# Patient Record
Sex: Male | Born: 1975 | Race: Black or African American | Hispanic: No | Marital: Married | Smoking: Never smoker
Health system: Southern US, Community
[De-identification: ages and names within clinical notes are randomized; demographics above are authoritative.]

## PROBLEM LIST (undated history)

## (undated) HISTORY — PX: CHOLECYSTECTOMY: SHX55

---

## 2015-03-08 ENCOUNTER — Emergency Department (HOSPITAL_COMMUNITY): Payer: 59

## 2015-03-08 ENCOUNTER — Encounter (HOSPITAL_COMMUNITY): Payer: Self-pay | Admitting: Family Medicine

## 2015-03-08 ENCOUNTER — Emergency Department (HOSPITAL_COMMUNITY)
Admission: EM | Admit: 2015-03-08 | Discharge: 2015-03-08 | Disposition: A | Discharge status: Home / Self Care | Payer: 59 | Attending: Emergency Medicine | Admitting: Emergency Medicine

## 2015-03-08 DIAGNOSIS — R0602 Shortness of breath: Secondary | ICD-10-CM | POA: Diagnosis not present

## 2015-03-08 DIAGNOSIS — R079 Chest pain, unspecified: Secondary | ICD-10-CM

## 2015-03-08 DIAGNOSIS — R0789 Other chest pain: Secondary | ICD-10-CM | POA: Diagnosis not present

## 2015-03-08 LAB — CBC
HCT: 43.7 % (ref 39.0–52.0)
Hemoglobin: 15.5 g/dL (ref 13.0–17.0)
MCH: 31.6 pg (ref 26.0–34.0)
MCHC: 35.5 g/dL (ref 30.0–36.0)
MCV: 89 fL (ref 78.0–100.0)
PLATELETS: 149 10*3/uL — AB (ref 150–400)
RBC: 4.91 MIL/uL (ref 4.22–5.81)
RDW: 13.6 % (ref 11.5–15.5)
WBC: 7.2 10*3/uL (ref 4.0–10.5)

## 2015-03-08 LAB — BASIC METABOLIC PANEL
Anion gap: 10 (ref 5–15)
BUN: 11 mg/dL (ref 6–20)
CALCIUM: 9 mg/dL (ref 8.9–10.3)
CO2: 26 mmol/L (ref 22–32)
CREATININE: 1.03 mg/dL (ref 0.61–1.24)
Chloride: 103 mmol/L (ref 101–111)
GFR calc non Af Amer: >60 mL/min (ref >60)
Glucose, Bld: 90 mg/dL (ref 65–99)
Potassium: 3.6 mmol/L (ref 3.5–5.1)
SODIUM: 139 mmol/L (ref 135–145)

## 2015-03-08 LAB — I-STAT TROPONIN, ED
TROPONIN I, POC: 0.01 ng/mL (ref 0.00–0.08)
TROPONIN I, POC: 0.01 ng/mL (ref 0.00–0.08)

## 2015-03-08 NOTE — ED Notes (Signed)
Pt reports he is experiencing right and left sided chest pain that started about 3 weeks ago but got severely worse today. Pt states he was riding from church when the pain got worse today.

## 2015-03-08 NOTE — ED Provider Notes (Signed)
CSN: [id number]     Arrival date & time 03/08/15  1705 History   First MD Initiated Contact with Patient 03/08/15 1725     Chief Complaint  Patient presents with  . Chest Pain     (Consider location/radiation/quality/duration/timing/severity/associated sxs/prior Treatment) HPI  40 year old male presents with chest pain. Has been having chest pain about every day for the past 3 weeks. Today around 1 PM when he was in the grocery store and then it was worse than typical. Patient states the pain is under his left breast and then moves to under his right breast. Currently is under his left. Pain seems to come and go randomly each day, since 1 pm has been constant. Feels like a heaviness. Some dyspnea as well. No vomiting, nausea or diaphoresis. Not worse or caused by exertion. Not pleuritic. No back pain/abdominal pain. No leg swelling/pain.  History reviewed. No pertinent past medical history. Past Surgical History  Procedure Laterality Date  . Cholecystectomy     History reviewed. No pertinent family history. Social History  Substance Use Topics  . Smoking status: Never Smoker   . Smokeless tobacco: None  . Alcohol Use: No    Review of Systems  Constitutional: Negative for fever.  Respiratory: Positive for shortness of breath.   Cardiovascular: Positive for chest pain. Negative for leg swelling.  Gastrointestinal: Negative for nausea and vomiting.  All other systems reviewed and are negative.     Allergies  Review of patient's allergies indicates not on file.  Home Medications   Prior to Admission medications   Not on File   BP 137/81 mmHg  Pulse 73  Temp(Src) 98.1 F (36.7 C) (Oral)  Resp 16  Ht 5\' 9"  (1.753 m)  Wt 258 lb (117.028 kg)  BMI 38.08 kg/m2  SpO2 100% Physical Exam  Constitutional: He is oriented to person, place, and time. He appears well-developed and well-nourished.  HENT:  Head: Normocephalic and atraumatic.  Right Ear: External ear normal.   Left Ear: External ear normal.  Nose: Nose normal.  Eyes: Right eye exhibits no discharge. Left eye exhibits no discharge.  Neck: Neck supple.  Cardiovascular: Normal rate, regular rhythm, normal heart sounds and intact distal pulses.   No murmur heard. Pulses:      Radial pulses are 2+ on the right side, and 2+ on the left side.  Pulmonary/Chest: Effort normal and breath sounds normal. He exhibits no tenderness.  Abdominal: Soft. There is no tenderness.  Musculoskeletal: He exhibits no edema.  Neurological: He is alert and oriented to person, place, and time.  Skin: Skin is warm and dry.  Nursing note and vitals reviewed.   ED Course  Procedures (including critical care time) Labs Review Labs Reviewed  CBC - Abnormal; Notable for the following:    Platelets 149 (*)    All other components within normal limits  BASIC METABOLIC PANEL  I-STAT TROPOININ, ED  Randolm Idol, ED    Imaging Review Dg Chest 2 View  03/08/2015  CLINICAL DATA:  Chest pain for 3 days EXAM: CHEST  2 VIEW COMPARISON:  None. FINDINGS: The heart size and mediastinal contours are within normal limits. Both lungs are clear. The visualized skeletal structures are unremarkable. IMPRESSION: No active cardiopulmonary disease. Electronically Signed   By: Inez Catalina M.D.   On: 03/08/2015 17:44   I have personally reviewed and evaluated these images and lab results as part of my medical decision-making.   EKG Interpretation   Date/Time:  Sunday  March 08 2015 17:12:28 EST Ventricular Rate:  72 PR Interval:  175 QRS Duration: 95 QT Interval:  391 QTC Calculation: 428 R Axis:   31 Text Interpretation:  Sinus rhythm Abnormal R-wave progression, early  transition no acute ST/T changes Baseline wander in lead(s) V1 V3 V5 No  old tracing to compare Confirmed by Juda ([id number]) on  03/08/2015 5:26:28 PM      MDM   Final diagnoses:  Chest pain, unspecified chest pain type    Patient has  had intermittent atypical chest pain for about 3 weeks. Was pain is worse today, I highly doubt ACS with negative ECG and 2 negative troponins. Patient's pain is not consistent with pulmonary embolism and he is low risk with a negative PERC score. Highly doubt dissection. At this point, plan to discharge home given he has good outpatient follow-up and discuss the importance of good follow-up. Discussed strict return precautions. Likely this is musculoskeletal.    Sherwood Gambler, MD 03/09/15 [id number]

## 2015-03-08 NOTE — ED Notes (Signed)
Patient transported to X-ray 

## 2015-08-25 ENCOUNTER — Other Ambulatory Visit: Payer: Self-pay | Admitting: Occupational Medicine

## 2015-08-25 ENCOUNTER — Ambulatory Visit: Payer: Self-pay

## 2015-08-25 DIAGNOSIS — Z0000 Encounter for general adult medical examination without abnormal findings: Secondary | ICD-10-CM

## 2016-04-21 DIAGNOSIS — Z0000 Encounter for general adult medical examination without abnormal findings: Secondary | ICD-10-CM | POA: Diagnosis not present

## 2016-04-21 DIAGNOSIS — M25562 Pain in left knee: Secondary | ICD-10-CM | POA: Diagnosis not present

## 2016-04-21 DIAGNOSIS — M25511 Pain in right shoulder: Secondary | ICD-10-CM | POA: Diagnosis not present

## 2016-05-16 DIAGNOSIS — M25511 Pain in right shoulder: Secondary | ICD-10-CM | POA: Diagnosis not present

## 2016-05-30 DIAGNOSIS — M542 Cervicalgia: Secondary | ICD-10-CM | POA: Diagnosis not present

## 2016-05-30 DIAGNOSIS — M25512 Pain in left shoulder: Secondary | ICD-10-CM | POA: Diagnosis not present

## 2016-05-30 DIAGNOSIS — M25511 Pain in right shoulder: Secondary | ICD-10-CM | POA: Diagnosis not present

## 2017-03-25 IMAGING — CR DG CHEST 2V
2 series · 2 of 2 positions shown · non-contrast
Comparison: None.

CLINICAL DATA: Chest pain for 3 days

EXAM:
CHEST  2 VIEW

[w chest pa]
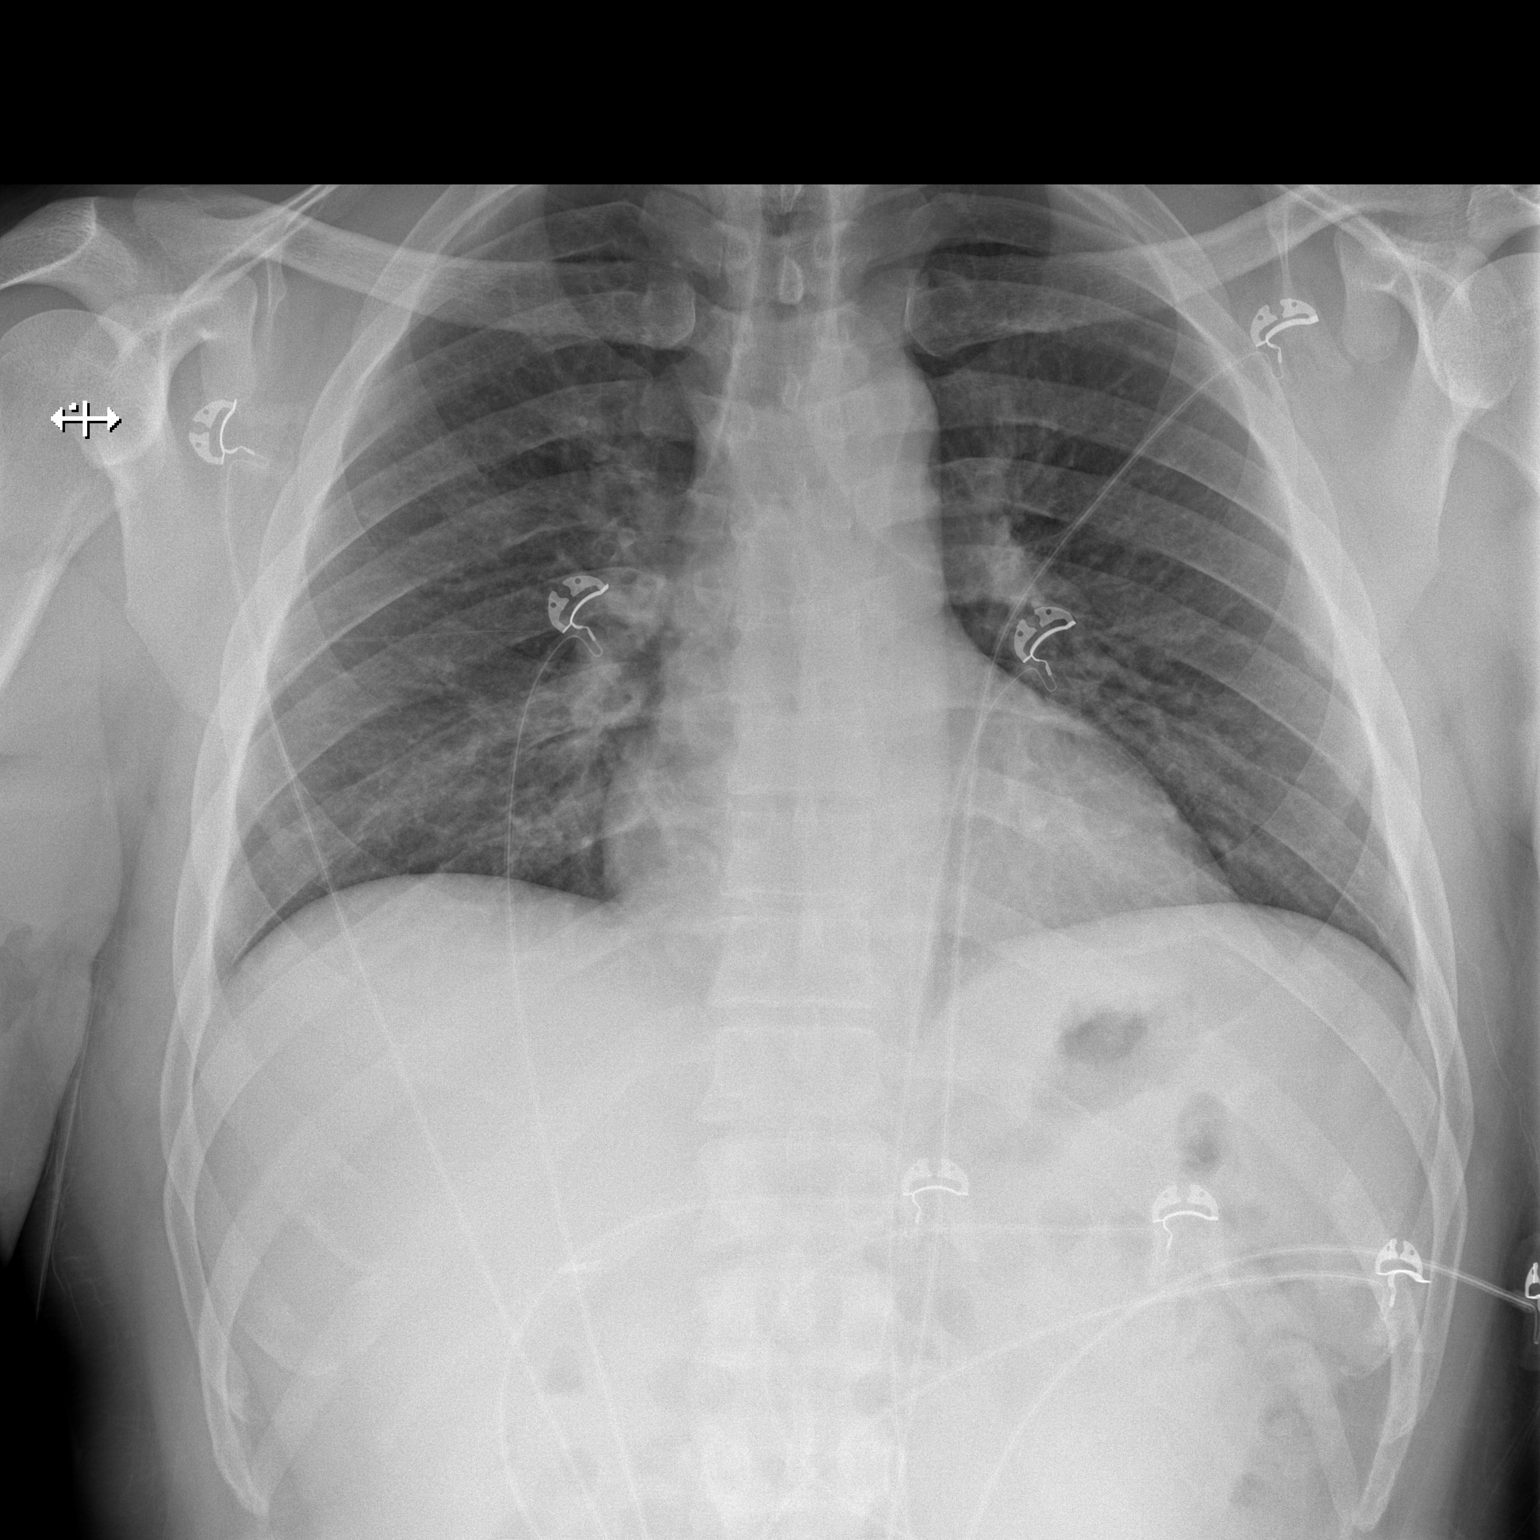

[w chest lat]
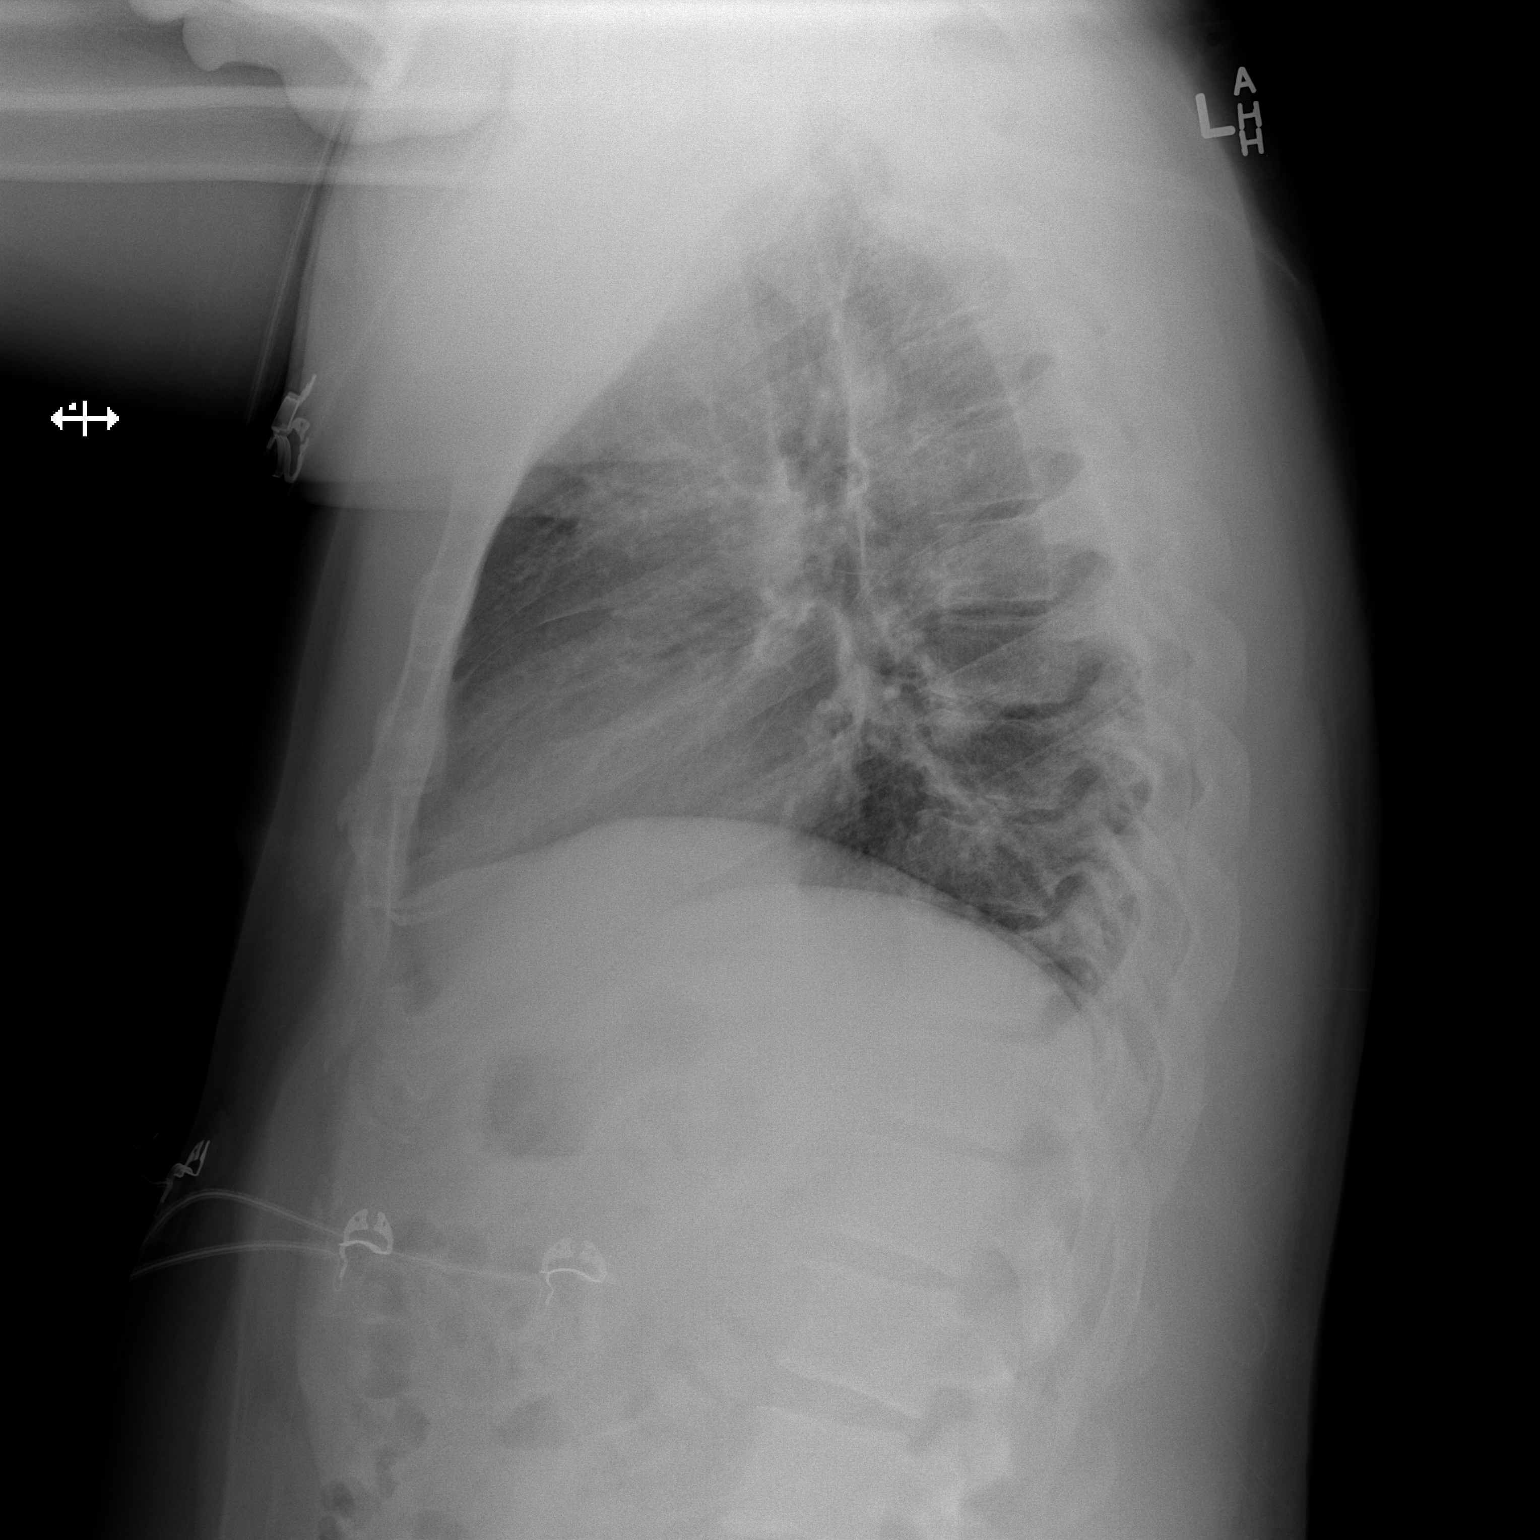

[2 of 2 positions shown; findings below may reference images not displayed]

FINDINGS: The heart size and mediastinal contours are within normal limits.
Both lungs are clear. The visualized skeletal structures are
unremarkable.
IMPRESSION: No active cardiopulmonary disease.

## 2017-05-29 DIAGNOSIS — Z0000 Encounter for general adult medical examination without abnormal findings: Secondary | ICD-10-CM | POA: Diagnosis not present

## 2017-07-04 DIAGNOSIS — D696 Thrombocytopenia, unspecified: Secondary | ICD-10-CM | POA: Diagnosis not present

## 2017-09-13 DIAGNOSIS — Z3141 Encounter for fertility testing: Secondary | ICD-10-CM | POA: Diagnosis not present

## 2017-10-12 DIAGNOSIS — Z3189 Encounter for other procreative management: Secondary | ICD-10-CM | POA: Diagnosis not present

## 2017-10-13 DIAGNOSIS — R208 Other disturbances of skin sensation: Secondary | ICD-10-CM | POA: Diagnosis not present

## 2017-10-13 DIAGNOSIS — D239 Other benign neoplasm of skin, unspecified: Secondary | ICD-10-CM | POA: Diagnosis not present

## 2017-10-13 DIAGNOSIS — L918 Other hypertrophic disorders of the skin: Secondary | ICD-10-CM | POA: Diagnosis not present

## 2017-10-13 DIAGNOSIS — L538 Other specified erythematous conditions: Secondary | ICD-10-CM | POA: Diagnosis not present
